# Patient Record
Sex: Male | Born: 1970 | Race: White | Hispanic: No | Marital: Married | Smoking: Never smoker
Health system: Southern US, Community
[De-identification: ages and names within clinical notes are randomized; demographics above are authoritative.]

## PROBLEM LIST (undated history)

## (undated) DIAGNOSIS — G4730 Sleep apnea, unspecified: Secondary | ICD-10-CM

## (undated) HISTORY — PX: HERNIA REPAIR: SHX51

---

## 1998-10-07 ENCOUNTER — Emergency Department (HOSPITAL_COMMUNITY): Admission: EM | Admit: 1998-10-07 | Discharge: 1998-10-07 | Payer: Self-pay | Admitting: Emergency Medicine

## 2002-06-30 ENCOUNTER — Emergency Department (HOSPITAL_COMMUNITY): Admission: EM | Admit: 2002-06-30 | Discharge: 2002-06-30 | Payer: Self-pay | Admitting: Emergency Medicine

## 2002-06-30 ENCOUNTER — Encounter: Payer: Self-pay | Admitting: Emergency Medicine

## 2003-12-10 ENCOUNTER — Emergency Department (HOSPITAL_COMMUNITY): Admission: EM | Admit: 2003-12-10 | Discharge: 2003-12-10 | Payer: Self-pay | Admitting: Emergency Medicine

## 2003-12-15 ENCOUNTER — Emergency Department (HOSPITAL_COMMUNITY): Admission: EM | Admit: 2003-12-15 | Discharge: 2003-12-15 | Payer: Self-pay | Admitting: Emergency Medicine

## 2004-03-14 ENCOUNTER — Emergency Department (HOSPITAL_COMMUNITY): Admission: EM | Admit: 2004-03-14 | Discharge: 2004-03-14 | Payer: Self-pay | Admitting: Emergency Medicine

## 2004-08-18 ENCOUNTER — Encounter: Admission: RE | Admit: 2004-08-18 | Discharge: 2004-08-18 | Payer: Self-pay | Admitting: Gastroenterology

## 2004-08-26 ENCOUNTER — Emergency Department (HOSPITAL_COMMUNITY): Admission: EM | Admit: 2004-08-26 | Discharge: 2004-08-26 | Payer: Self-pay | Admitting: Emergency Medicine

## 2005-09-05 ENCOUNTER — Emergency Department (HOSPITAL_COMMUNITY): Admission: EM | Admit: 2005-09-05 | Discharge: 2005-09-06 | Payer: Self-pay | Admitting: Emergency Medicine

## 2006-01-02 IMAGING — RF DG UGI W/ HIGH DENSITY W/KUB
12 series · 17 of 17 positions shown · non-contrast
Comparison: none

CLINICAL DATA: Chest pain.
 ULTRASOUND OF ABDOMEN, COMPLETE:
 There is considerable bowel gas and, as a result, there is limited visualization of the pancreas, inferior vena cava, and abdominal aorta. The gallbladder is adequately visualized and has a normal appearance. The common bile duct measures 4 mm in diameter.  The distal common bile duct is obscured by gas.  Pancreas is not well visualized.  Also, the left lobe of liver is obscured by gas.  The spleen and kidneys have a normal appearance.

[Series 1: run · 1 of 1 slices shown (1 of 11)]
[im 1/1]
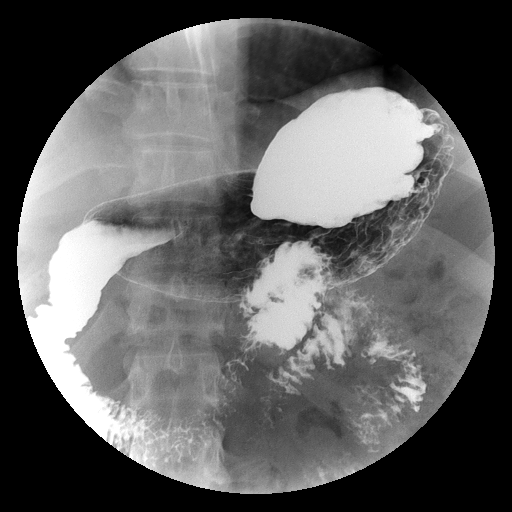

[Series 2: run · 1 of 1 slices shown (2 of 11)]
[im 1/1]
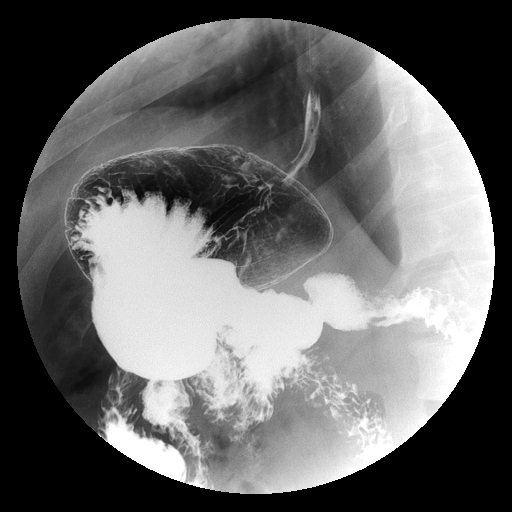

[Series 3: run · 1 of 1 slices shown (3 of 11)]
[im 1/1]
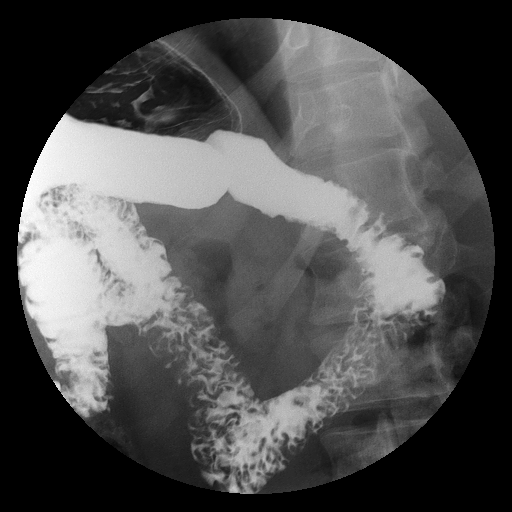

[Series 4: run · 5 of 5 slices shown (4 of 11)]
[im 1/5]
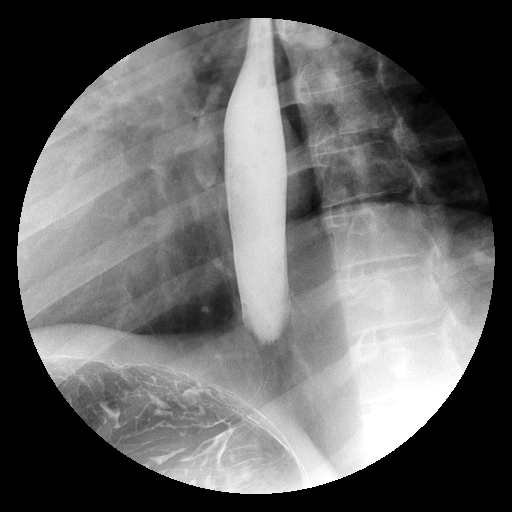
[im 2/5]
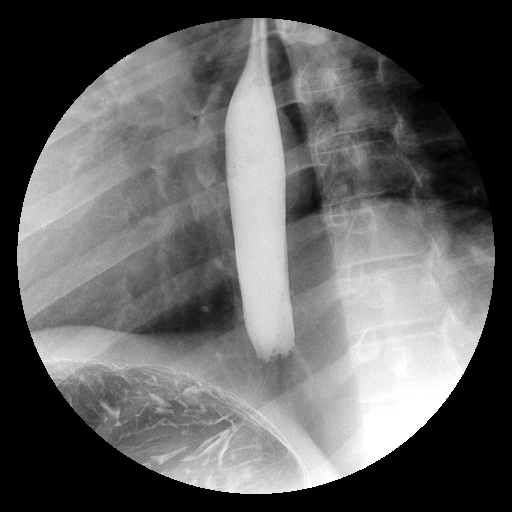
[im 3/5]
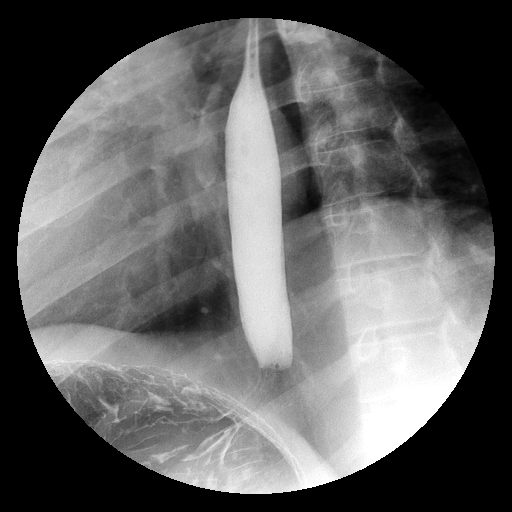
[im 4/5]
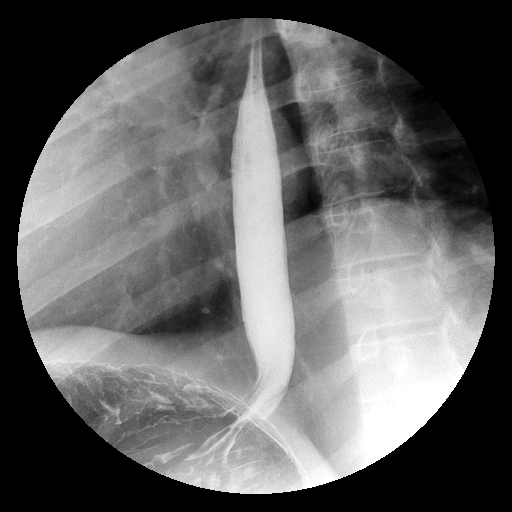
[im 5/5]
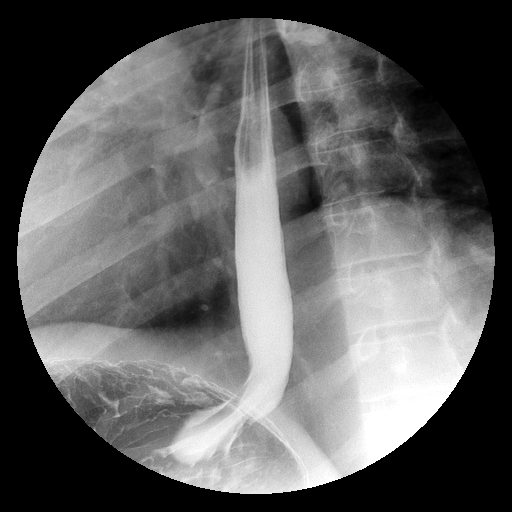

[Series 5: run · 2 of 2 slices shown (5 of 11)]
[im 1/2]
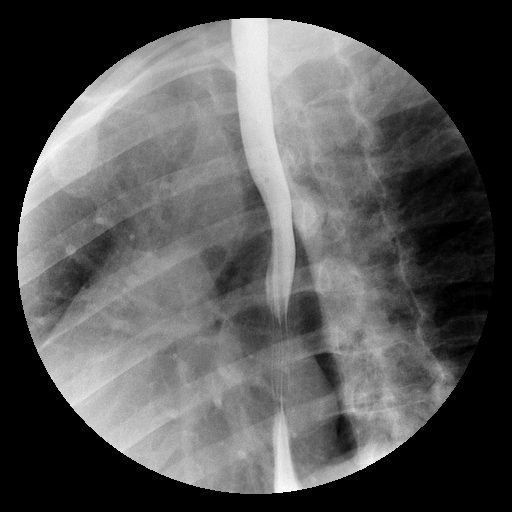
[im 2/2]
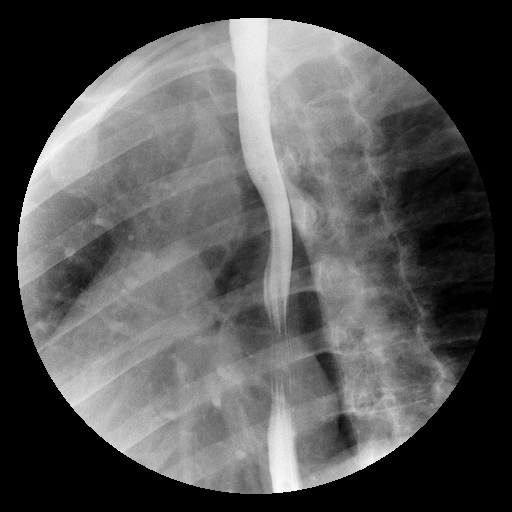

[Series 6: run · 1 of 1 slices shown (6 of 11)]
[im 1/1]
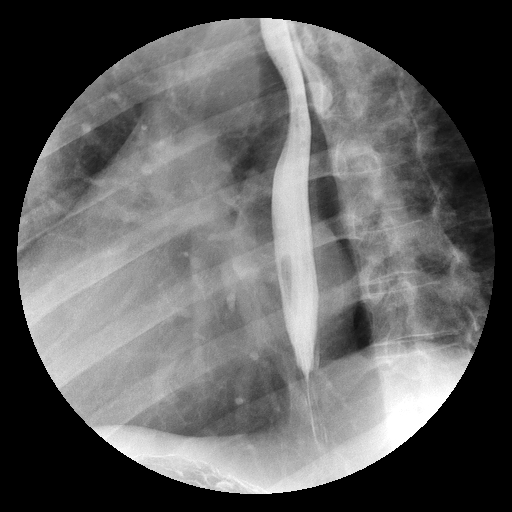

[Series 7: run · 1 of 1 slices shown (7 of 11)]
[im 1/1]
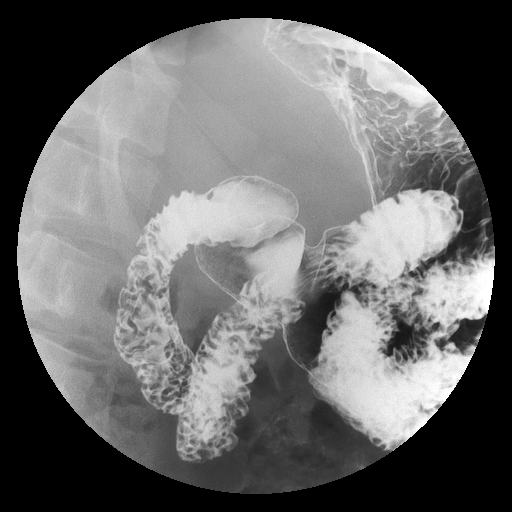

[Series 8: run · 1 of 1 slices shown (8 of 11)]
[im 1/1]
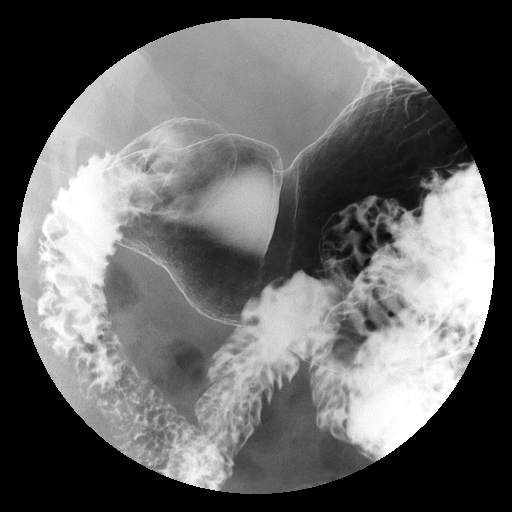

[Series 9: run · 1 of 1 slices shown (9 of 11)]
[im 1/1]
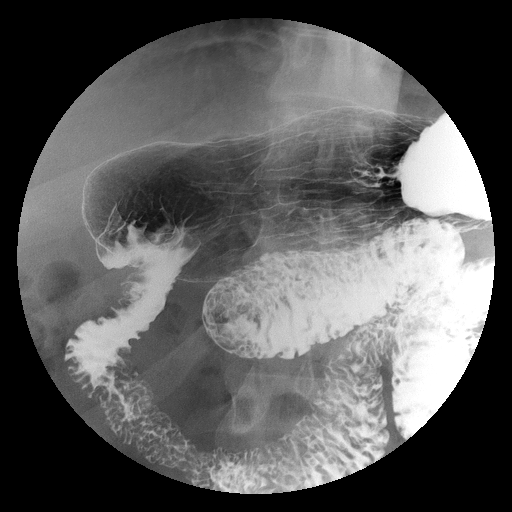

[Series 10: run · 1 of 1 slices shown (10 of 11)]
[im 1/1]
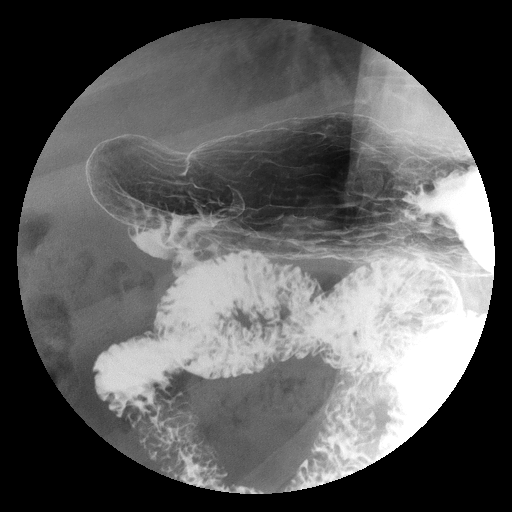

[Series 11: run · 1 of 1 slices shown (11 of 11)]
[im 1/1]
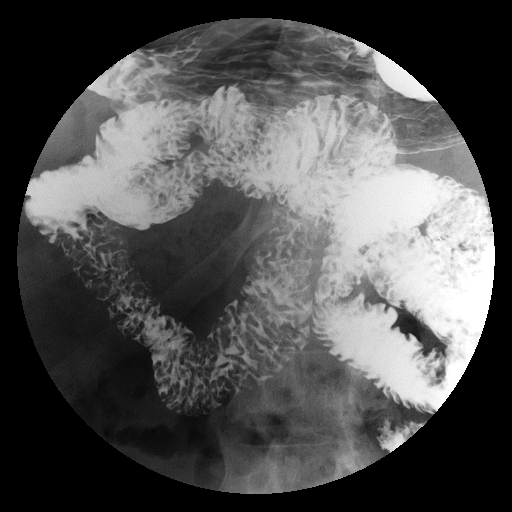

[Series 1001: view not recorded · 0.20mm/px · 1 of 1 slices shown]
[im 1/1]
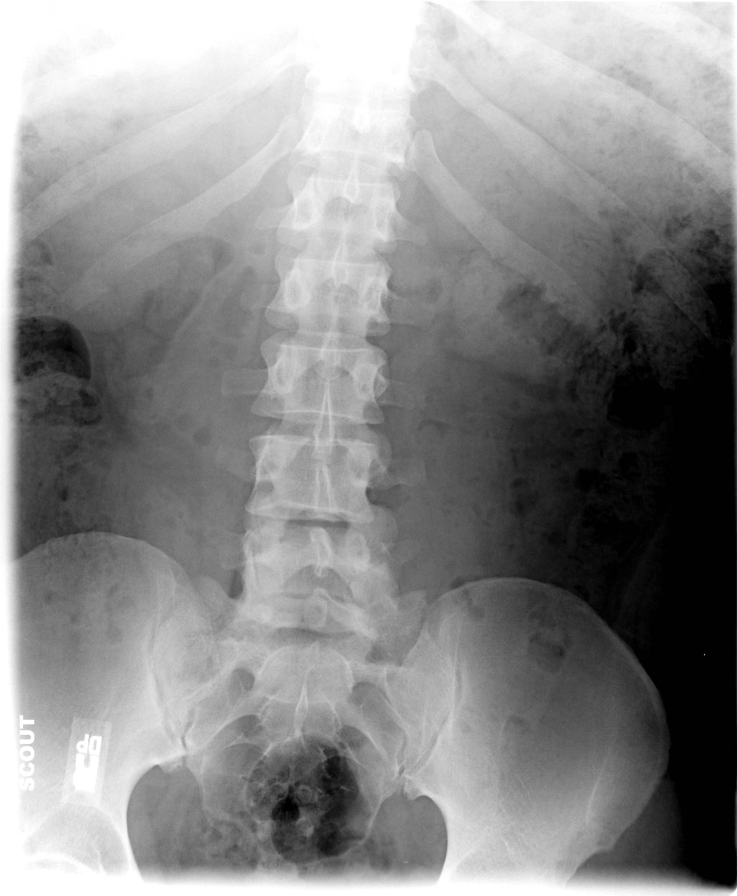

[17 of 17 positions shown; findings below may reference images not displayed]

IMPRESSION: Limited study due to bowel gas, as discussed above.  Normal appearing gallbladder and common bile duct.
 AIR CONTRAST UPPER GI SERIES:
 The primary peristaltic wave in the esophagus is normal.  There was no hiatal hernia and there were no episodes of spontaneous gastroesophageal reflux.  The stomach, duodenal bulb, and sweep were adequately visualized and had a normal appearance.
IMPRESSION: Normal study.

## 2012-08-16 ENCOUNTER — Encounter: Payer: Self-pay | Admitting: *Deleted

## 2012-08-16 ENCOUNTER — Emergency Department (INDEPENDENT_AMBULATORY_CARE_PROVIDER_SITE_OTHER)
Admission: EM | Admit: 2012-08-16 | Discharge: 2012-08-16 | Disposition: A | Discharge status: Home / Self Care | Payer: BC Managed Care – PPO | Source: Home / Self Care | Attending: Family Medicine | Admitting: Family Medicine

## 2012-08-16 DIAGNOSIS — J329 Chronic sinusitis, unspecified: Secondary | ICD-10-CM

## 2012-08-16 DIAGNOSIS — J40 Bronchitis, not specified as acute or chronic: Secondary | ICD-10-CM

## 2012-08-16 DIAGNOSIS — J029 Acute pharyngitis, unspecified: Secondary | ICD-10-CM

## 2012-08-16 MED ORDER — AZITHROMYCIN 250 MG PO TABS: ORAL_TABLET | ORAL | Status: DC

## 2012-08-16 MED ORDER — METHYLPREDNISOLONE ACETATE 80 MG/ML IJ SUSP
80.0000 mg | Freq: Once | INTRAMUSCULAR | Status: AC
  Administered 2012-08-16: 80 mg via INTRAMUSCULAR

## 2012-08-16 MED ORDER — ALBUTEROL SULFATE HFA 108 (90 BASE) MCG/ACT IN AERS: 2.0000 | INHALATION_SPRAY | Freq: Four times a day (QID) | RESPIRATORY_TRACT | Status: AC | PRN

## 2012-08-16 NOTE — ED Notes (Signed)
Pt c/o sore throat, cough and nasal congestion x 3 days. Denies fever.

## 2012-08-16 NOTE — ED Provider Notes (Signed)
History     CSN: [id number]  Arrival date & time 08/16/12  1448   First MD Initiated Contact with Patient 08/16/12 1504      Chief Complaint  Patient presents with  . Sore Throat  . Cough  . Nasal Congestion   HPI  URI Symptoms Onset: 3-4 days  Description: sinus pressure, nasal congestion, cough  Modifying factors:  Prior hx/o bronchitis, PNA. No SOB. Sxs not similar to previous episodes of PNA. Pt works in [employer]. Cough seems to be induced by cold air.    Symptoms Nasal discharge: yes Fever: no Sore throat: no Cough: yes Wheezing: mild Ear pain: no GI symptoms: no Sick contacts: yes  Red Flags  Stiff neck: no Dyspnea: no Rash: no Swallowing difficulty: no  Sinusitis Risk Factors Headache/face pain: yes Double sickening: no tooth pain: no  Allergy Risk Factors Sneezing: no Itchy scratchy throat: no Seasonal symptoms: no  Flu Risk Factors Headache: no muscle aches: no severe fatigue: no    History reviewed. No pertinent past medical history.  Past Surgical History  Procedure Laterality Date  . Hernia repair      Family History  Problem Relation Age of Onset  . Diabetes Father   . Rheum arthritis Father     History  Substance Use Topics  . Smoking status: Never Smoker   . Smokeless tobacco: Not on file  . Alcohol Use: No      Review of Systems  All other systems reviewed and are negative.    Allergies  Review of patient's allergies indicates no known allergies.  Home Medications   Current Outpatient Rx  Name  Route  Sig  Dispense  Refill  . albuterol (PROVENTIL HFA;VENTOLIN HFA) 108 (90 BASE) MCG/ACT inhaler   Inhalation   Inhale 2 puffs into the lungs every 6 (six) hours as needed for wheezing.   1 Inhaler   2   . azithromycin (ZITHROMAX) 250 MG tablet      Take 2 tabs PO x 1 dose, then 1 tab PO QD x 4 days   6 tablet   0     BP 133/83  Pulse 86  Temp(Src) 97.9 F (36.6 C) (Oral)  Ht 6' (1.829 m)   Wt 271 lb (122.925 kg)  BMI 36.75 kg/m2  SpO2 99%  Physical Exam  Constitutional:  Obese   HENT:  Head: Normocephalic and atraumatic.  Right Ear: External ear normal.  Left Ear: External ear normal.  +nasal erythema, rhinorrhea bilaterally, + post oropharyngeal erythema  + maxillary tenderness bilaterally    Cardiovascular: Normal rate and regular rhythm.   Pulmonary/Chest: Effort normal and breath sounds normal.  Abdominal: Soft.  Musculoskeletal: Normal range of motion.  Neurological: He is alert.  Skin: Skin is warm.    ED Course  Procedures (including critical care time)  Labs Reviewed  POCT RAPID STREP A (OFFICE)   No results found.   1. Sinusitis   2. Bronchitis       MDM  Will treat with zpak.  Depomedrol 80mg  IMx1 as there is an element of cold induced bronchospasm in sxs.  Albuterol as needed.  No clinical signs of PNA currently (lungs clear, afebrile, no SOB).  Zpak will give good resp coverage.  Discussed infectious and resp red flags at length.  Follow up as needed.     The patient and/or caregiver has been counseled thoroughly with regard to treatment plan and/or medications prescribed including dosage, schedule, interactions, rationale for use,  and possible side effects and they verbalize understanding. Diagnoses and expected course of recovery discussed and will return if not improved as expected or if the condition worsens. Patient and/or caregiver verbalized understanding.             Doree Albee, MD 08/16/12 [phone]

## 2012-11-07 ENCOUNTER — Emergency Department (INDEPENDENT_AMBULATORY_CARE_PROVIDER_SITE_OTHER)
Admission: EM | Admit: 2012-11-07 | Discharge: 2012-11-07 | Disposition: A | Discharge status: Home / Self Care | Payer: BC Managed Care – PPO | Source: Home / Self Care

## 2012-11-07 ENCOUNTER — Encounter: Payer: Self-pay | Admitting: *Deleted

## 2012-11-07 DIAGNOSIS — M7061 Trochanteric bursitis, right hip: Secondary | ICD-10-CM

## 2012-11-07 DIAGNOSIS — B029 Zoster without complications: Secondary | ICD-10-CM

## 2012-11-07 DIAGNOSIS — M76899 Other specified enthesopathies of unspecified lower limb, excluding foot: Secondary | ICD-10-CM

## 2012-11-07 MED ORDER — VALACYCLOVIR HCL 1 G PO TABS: 1000.0000 mg | ORAL_TABLET | Freq: Three times a day (TID) | ORAL | Status: DC

## 2012-11-07 NOTE — ED Provider Notes (Signed)
History     CSN: [id number]  Arrival date & time 11/07/12  1241   None     Chief Complaint  Patient presents with  . Rash  . Hip Pain      HPI Comments: Patient presents with two complaints: 1)  Yesterday he felt unusually fatigued.  Later in the day he noticed a slightly pruritic rash on his left lower back.  Today the rash has increased in size.  No fevers, chills, and sweats.  2)  For about a month he has had localized pain over his right hip.  The pain does not radiate, and is worse with flexion of his hip.  He recalls no trauma to the area or change in activities.  He has pain when supine in bed on his right side.  Patient is a 42 y.o. male presenting with rash and hip pain. The history is provided by the patient.  Rash Location: left lower back. Quality: itchiness and redness   Quality: not blistering, not draining, not painful, not swelling and not weeping   Severity:  Mild Onset quality:  Sudden Duration:  1 day Timing:  Constant Progression:  Worsening Chronicity:  New Context: not animal contact, not chemical exposure, not insect bite/sting, not medications, not new detergent/soap and not plant contact   Relieved by:  Nothing Worsened by:  Nothing tried Ineffective treatments:  None tried Associated symptoms: fatigue   Associated symptoms: no abdominal pain, no fever, no headaches and no myalgias   Hip Pain This is a new problem. Episode onset: 1.5 months ago. The problem occurs daily. The problem has not changed since onset.Pertinent negatives include no abdominal pain and no headaches. Associated symptoms comments: none. The symptoms are aggravated by walking. Nothing relieves the symptoms. He has tried nothing for the symptoms.    History reviewed. No pertinent past medical history.  Past Surgical History  Procedure Laterality Date  . Hernia repair      Family History  Problem Relation Age of Onset  . Diabetes Father   . Rheum arthritis Father   .  Cancer Mother     History  Substance Use Topics  . Smoking status: Never Smoker   . Smokeless tobacco: Not on file  . Alcohol Use: No      Review of Systems  Constitutional: Positive for fatigue. Negative for fever.  Gastrointestinal: Negative for abdominal pain.  Musculoskeletal: Negative for myalgias.  Skin: Positive for rash.  Neurological: Negative for headaches.  All other systems reviewed and are negative.    Allergies  Review of patient's allergies indicates no known allergies.  Home Medications   Current Outpatient Rx  Name  Route  Sig  Dispense  Refill  . albuterol (PROVENTIL HFA;VENTOLIN HFA) 108 (90 BASE) MCG/ACT inhaler   Inhalation   Inhale 2 puffs into the lungs every 6 (six) hours as needed for wheezing.   1 Inhaler   2   . azithromycin (ZITHROMAX) 250 MG tablet      Take 2 tabs PO x 1 dose, then 1 tab PO QD x 4 days   6 tablet   0   . valACYclovir (VALTREX) 1000 MG tablet   Oral   Take 1 tablet (1,000 mg total) by mouth 3 (three) times daily.   21 tablet   0     BP 124/88  Pulse 103  Temp(Src) 98.2 F (36.8 C) (Oral)  Resp 16  Ht 5\' 11"  (1.803 m)  Wt 273 lb (123.832 kg)  BMI 38.09 kg/m2  SpO2 97%  Physical Exam  Nursing note and vitals reviewed. Constitutional: He is oriented to person, place, and time. He appears well-developed and well-nourished. No distress.  Patient is obese (BMI 38.1)  HENT:  Head: Normocephalic.  Eyes: Conjunctivae are normal. Pupils are equal, round, and reactive to light.  Cardiovascular: Normal heart sounds.   Pulmonary/Chest: Breath sounds normal.  Abdominal: There is no tenderness.  Musculoskeletal: Normal range of motion. He exhibits tenderness.       Right hip: He exhibits tenderness. He exhibits normal range of motion, normal strength, no swelling, no crepitus and no deformity.       Legs: Right hip reveals distinct tenderness over the greater trochanter.  Palpating the greater trochanter during  resisted lateral abduction of the hip recreates his pain.   Lymphadenopathy:    He has no cervical adenopathy.  Neurological: He is alert and oriented to person, place, and time.  Skin: Skin is warm and dry. Rash noted. No purpura noted. Rash is macular. Rash is not papular, not nodular, not pustular, not vesicular and not urticarial.     On the left sacral region as noted on diagram is an erythematous herpetiform eruption without tenderness, swelling, or fluctuance.    ED Course  Procedures None      1. Herpes zoster   2. Trochanteric bursitis of right hip       MDM  Begin Valtrex. Begin Ibuprofen 200mg , 4 tabs every 8 hours with food.  Apply ice pack to right hip 3 to 4 times daily.  Begin hip exercises as per instruction sheet ([facility] information and instruction handout given)  Followup with [facility] if not improving about two weeks.         Lattie Haw, MD 11/07/12 [phone]

## 2012-11-07 NOTE — ED Notes (Signed)
Pt c/o fatigue and rash on his LT low back area x 1 day. Denies fever. He denies recent tick bite. He also c/o intermittent RT hip pain x . Denies injury.

## 2013-06-24 ENCOUNTER — Emergency Department (INDEPENDENT_AMBULATORY_CARE_PROVIDER_SITE_OTHER): Payer: BC Managed Care – PPO

## 2013-06-24 ENCOUNTER — Encounter: Payer: Self-pay | Admitting: Emergency Medicine

## 2013-06-24 ENCOUNTER — Emergency Department (INDEPENDENT_AMBULATORY_CARE_PROVIDER_SITE_OTHER)
Admission: EM | Admit: 2013-06-24 | Discharge: 2013-06-24 | Disposition: A | Discharge status: Home / Self Care | Payer: BC Managed Care – PPO | Source: Home / Self Care | Attending: Family Medicine | Admitting: Family Medicine

## 2013-06-24 DIAGNOSIS — R05 Cough: Secondary | ICD-10-CM

## 2013-06-24 DIAGNOSIS — R0602 Shortness of breath: Secondary | ICD-10-CM

## 2013-06-24 DIAGNOSIS — J069 Acute upper respiratory infection, unspecified: Secondary | ICD-10-CM

## 2013-06-24 DIAGNOSIS — R059 Cough, unspecified: Secondary | ICD-10-CM

## 2013-06-24 DIAGNOSIS — Z8701 Personal history of pneumonia (recurrent): Secondary | ICD-10-CM

## 2013-06-24 MED ORDER — AZITHROMYCIN 250 MG PO TABS: ORAL_TABLET | ORAL | Status: DC

## 2013-06-24 NOTE — ED Notes (Signed)
Pt has sore throat ear pain cough and congestion lasting 5 days.

## 2013-06-24 NOTE — ED Provider Notes (Signed)
CSN: [id number]     Arrival date & time 06/24/13  1625 History   First MD Initiated Contact with Patient 06/24/13 1644     Chief Complaint  Patient presents with  . Sore Throat    HPI  URI Symptoms Onset: 5 days  Description: rhinorrhea, nasal congestion, cough, mild dyspnea  Modifying factors:  Prior hx/o PNA. Sxs do not seem similar to past PNAs, though with some SOB.   Symptoms Nasal discharge: yes Fever: no Sore throat: yes Cough: yes Wheezing: no Ear pain: no GI symptoms: no Sick contacts: yes  Red Flags  Stiff neck: no Dyspnea: minimal  Rash: no Swallowing difficulty: n  Sinusitis Risk Factors Headache/face pain: no Double sickening: no tooth pain: no  Allergy Risk Factors Sneezing: no Itchy scratchy throat: no Seasonal symptoms: no  Flu Risk Factors Headache: no muscle aches: no severe fatigue: no   History reviewed. No pertinent past medical history. Past Surgical History  Procedure Laterality Date  . Hernia repair     Family History  Problem Relation Age of Onset  . Diabetes Father   . Rheum arthritis Father   . Cancer Mother    History  Substance Use Topics  . Smoking status: Never Smoker   . Smokeless tobacco: Not on file  . Alcohol Use: No    Review of Systems  All other systems reviewed and are negative.    Allergies  Review of patient's allergies indicates no known allergies.  Home Medications   Current Outpatient Rx  Name  Route  Sig  Dispense  Refill  . albuterol (PROVENTIL HFA;VENTOLIN HFA) 108 (90 BASE) MCG/ACT inhaler   Inhalation   Inhale 2 puffs into the lungs every 6 (six) hours as needed for wheezing.   1 Inhaler   2   . azithromycin (ZITHROMAX) 250 MG tablet      Take 2 tabs PO x 1 dose, then 1 tab PO QD x 4 days   6 tablet   0   . valACYclovir (VALTREX) 1000 MG tablet   Oral   Take 1 tablet (1,000 mg total) by mouth 3 (three) times daily.   21 tablet   0    BP 123/80  Pulse 95  Temp(Src) 98.7 F  (37.1 C) (Oral)  Ht 6' (1.829 m)  Wt 289 lb 12.8 oz (131.452 kg)  BMI 39.30 kg/m2  SpO2 95% Physical Exam  Constitutional: He appears well-developed and well-nourished.  HENT:  Head: Normocephalic and atraumatic.  Right Ear: External ear normal.  Left Ear: External ear normal.  +nasal erythema, rhinorrhea bilaterally, + post oropharyngeal erythema    Eyes: Conjunctivae are normal. Pupils are equal, round, and reactive to light.  Neck: Normal range of motion. Neck supple.  Cardiovascular: Normal rate, regular rhythm and normal heart sounds.   Pulmonary/Chest: Effort normal and breath sounds normal. He has no wheezes. He has no rales.  Abdominal: Soft. Bowel sounds are normal.  Musculoskeletal: Normal range of motion.  Neurological: He is alert.  Skin: Skin is warm.    ED Course  Procedures (including critical care time) Labs Review Labs Reviewed  POCT RAPID STREP A (OFFICE)   Imaging Review Dg Chest 2 View  06/24/2013   CLINICAL DATA:  Cough  EXAM: CHEST  2 VIEW  COMPARISON:  09/06/2005  FINDINGS: Normal heart size.  Clear lungs.  IMPRESSION: No active cardiopulmonary disease.   Electronically Signed   By: Maryclare Bean M.D.   On: 06/24/2013 17:31  EKG Interpretation    Date/Time:    Ventricular Rate:    PR Interval:    QRS Duration:   QT Interval:    QTC Calculation:   R Axis:     Text Interpretation:              MDM   1. URI (upper respiratory infection)    Likely viral process Rapid strep negative CXR negative for PNA  Overall stable/reassuring resp exam.  Will rx ppx zpak in case sxs worse/fail to improve.  Discussed infectious and resp red flags at length.  Follow up as needed.      Doree Albee, MD 06/24/13 [phone]

## 2013-09-02 ENCOUNTER — Encounter: Payer: Self-pay | Admitting: Emergency Medicine

## 2013-09-02 ENCOUNTER — Emergency Department (INDEPENDENT_AMBULATORY_CARE_PROVIDER_SITE_OTHER)
Admission: EM | Admit: 2013-09-02 | Discharge: 2013-09-02 | Disposition: A | Discharge status: Home / Self Care | Payer: BC Managed Care – PPO | Source: Home / Self Care | Attending: Family Medicine | Admitting: Family Medicine

## 2013-09-02 DIAGNOSIS — J069 Acute upper respiratory infection, unspecified: Secondary | ICD-10-CM

## 2013-09-02 HISTORY — DX: Sleep apnea, unspecified: G47.30

## 2013-09-02 MED ORDER — PREDNISONE 20 MG PO TABS: 20.0000 mg | ORAL_TABLET | Freq: Two times a day (BID) | ORAL | Status: DC

## 2013-09-02 MED ORDER — AZITHROMYCIN 250 MG PO TABS
ORAL_TABLET | ORAL | Status: DC
Start: 2013-09-02 — End: 2015-09-10

## 2013-09-02 MED ORDER — BENZONATATE 200 MG PO CAPS: 200.0000 mg | ORAL_CAPSULE | Freq: Every day | ORAL | Status: DC

## 2013-09-02 NOTE — ED Provider Notes (Signed)
CSN: [id number]     Arrival date & time 09/02/13  1148 History   First MD Initiated Contact with Patient 09/02/13 1206     Chief Complaint  Patient presents with  . Cough  . Nasal Congestion      HPI Comments: Patient developed onset of nasal congestion three days ago, followed by sweats, mild sore throat, and cough. He has a history of mild asthma as a child.  He started treatment for sleep apnea three months ago.  The history is provided by the patient.    Past Medical History  Diagnosis Date  . Sleep apnea    Past Surgical History  Procedure Laterality Date  . Hernia repair     Family History  Problem Relation Age of Onset  . Diabetes Father   . Rheum arthritis Father   . Cancer Mother    History  Substance Use Topics  . Smoking status: Never Smoker   . Smokeless tobacco: Not on file  . Alcohol Use: No    Review of Systems + sore throat + cough No pleuritic pain No wheezing + nasal congestion + post-nasal drainage No sinus pain/pressure No itchy/red eyes No earache No hemoptysis No SOB No fever/chills No nausea No vomiting No abdominal pain No diarrhea No urinary symptoms No skin rash + fatigue No myalgias No headache Used OTC meds without relief  Allergies  Review of patient's allergies indicates no known allergies.  Home Medications   Current Outpatient Rx  Name  Route  Sig  Dispense  Refill  . albuterol (PROVENTIL HFA;VENTOLIN HFA) 108 (90 BASE) MCG/ACT inhaler   Inhalation   Inhale 2 puffs into the lungs every 6 (six) hours as needed for wheezing.   1 Inhaler   2   . azithromycin (ZITHROMAX) 250 MG tablet      Take 2 tabs PO x 1 dose, then 1 tab PO QD x 4 days   6 tablet   0   . benzonatate (TESSALON) 200 MG capsule   Oral   Take 1 capsule (200 mg total) by mouth at bedtime. Take as needed for cough   12 capsule   0   . predniSONE (DELTASONE) 20 MG tablet   Oral   Take 1 tablet (20 mg total) by mouth 2 (two) times daily. Take  with food.   10 tablet   0   . valACYclovir (VALTREX) 1000 MG tablet   Oral   Take 1 tablet (1,000 mg total) by mouth 3 (three) times daily.   21 tablet   0    BP 134/86  Pulse 93  Temp(Src) 98.3 F (36.8 C) (Oral)  Resp 16  Ht 5' 11"  (1.803 m)  Wt 294 lb 8 oz (133.584 kg)  BMI 41.09 kg/m2  SpO2 97% Physical Exam Nursing notes and Vital Signs reviewed. Appearance:  Patient appears stated age, and in no acute distress.  Patient is obese (BMI 41.1) Eyes:  Pupils are equal, round, and reactive to light and accomodation.  Extraocular movement is intact.  Conjunctivae are not inflamed  Ears:  Canals normal.  Tympanic membranes normal.  Nose:  Mildly congested turbinates.  No sinus tenderness.   Pharynx:  Normal Neck:  Supple.   Tender shotty posterior nodes are palpated bilaterally  Lungs:  Faint wheezes right posterior chest.  Breath sounds are equal.  Heart:  Regular rate and rhythm without murmurs, rubs, or gallops.  Abdomen:  Nontender without masses or hepatosplenomegaly.  Bowel sounds are present.  No CVA or flank tenderness.  Extremities:  No edema.  No calf tenderness Skin:  No rash present.   ED Course  Procedures none      MDM   1. Acute upper respiratory infections of unspecified site    Begin prednisone burst.  Prescription written for Benzonatate (Tessalon) to take at bedtime for night-time cough.  Begin Z-pack to cover atypicals. Take plain Mucinex (1200 mg guaifenesin) twice daily for cough and congestion.  May add Sudafed for sinus congestion.   Increase fluid intake, rest. May use Afrin nasal spray (or generic oxymetazoline) twice daily for about 5 days.  Also recommend using saline nasal spray several times daily and saline nasal irrigation (AYR is a common brand) Try warm salt water gargles for sore throat.  Stop all antihistamines for now, and other non-prescription cough/cold preparations. May take Ibuprofen 230m, 4 tabs every 8 hours with food for  fever, headache, etc.   Follow-up with family doctor if not improving 7 to 10 days.     SKandra Nicolas MD 09/04/13 [phone]

## 2013-09-02 NOTE — Discharge Instructions (Signed)
Take plain Mucinex (1200 mg guaifenesin) twice daily for cough and congestion.  May add Sudafed for sinus congestion.   Increase fluid intake, rest. May use Afrin nasal spray (or generic oxymetazoline) twice daily for about 5 days.  Also recommend using saline nasal spray several times daily and saline nasal irrigation (AYR is a common brand) Try warm salt water gargles for sore throat.  Stop all antihistamines for now, and other non-prescription cough/cold preparations. May take Ibuprofen 237m, 4 tabs every 8 hours with food for fever, headache, etc.   Follow-up with family doctor if not improving 7 to 10 days.

## 2013-09-02 NOTE — ED Notes (Signed)
Patient c/o productive cough, nasal congestion and sore throat x 3 days. Has tried OTC Zicam and Tylenol Sinus without relief.

## 2014-11-08 IMAGING — CR DG CHEST 2V
2 series · 2 of 2 positions shown · non-contrast
Comparison: 09/06/2005

CLINICAL DATA: Cough

EXAM:
CHEST  2 VIEW

[view not recorded (1 of 2)]
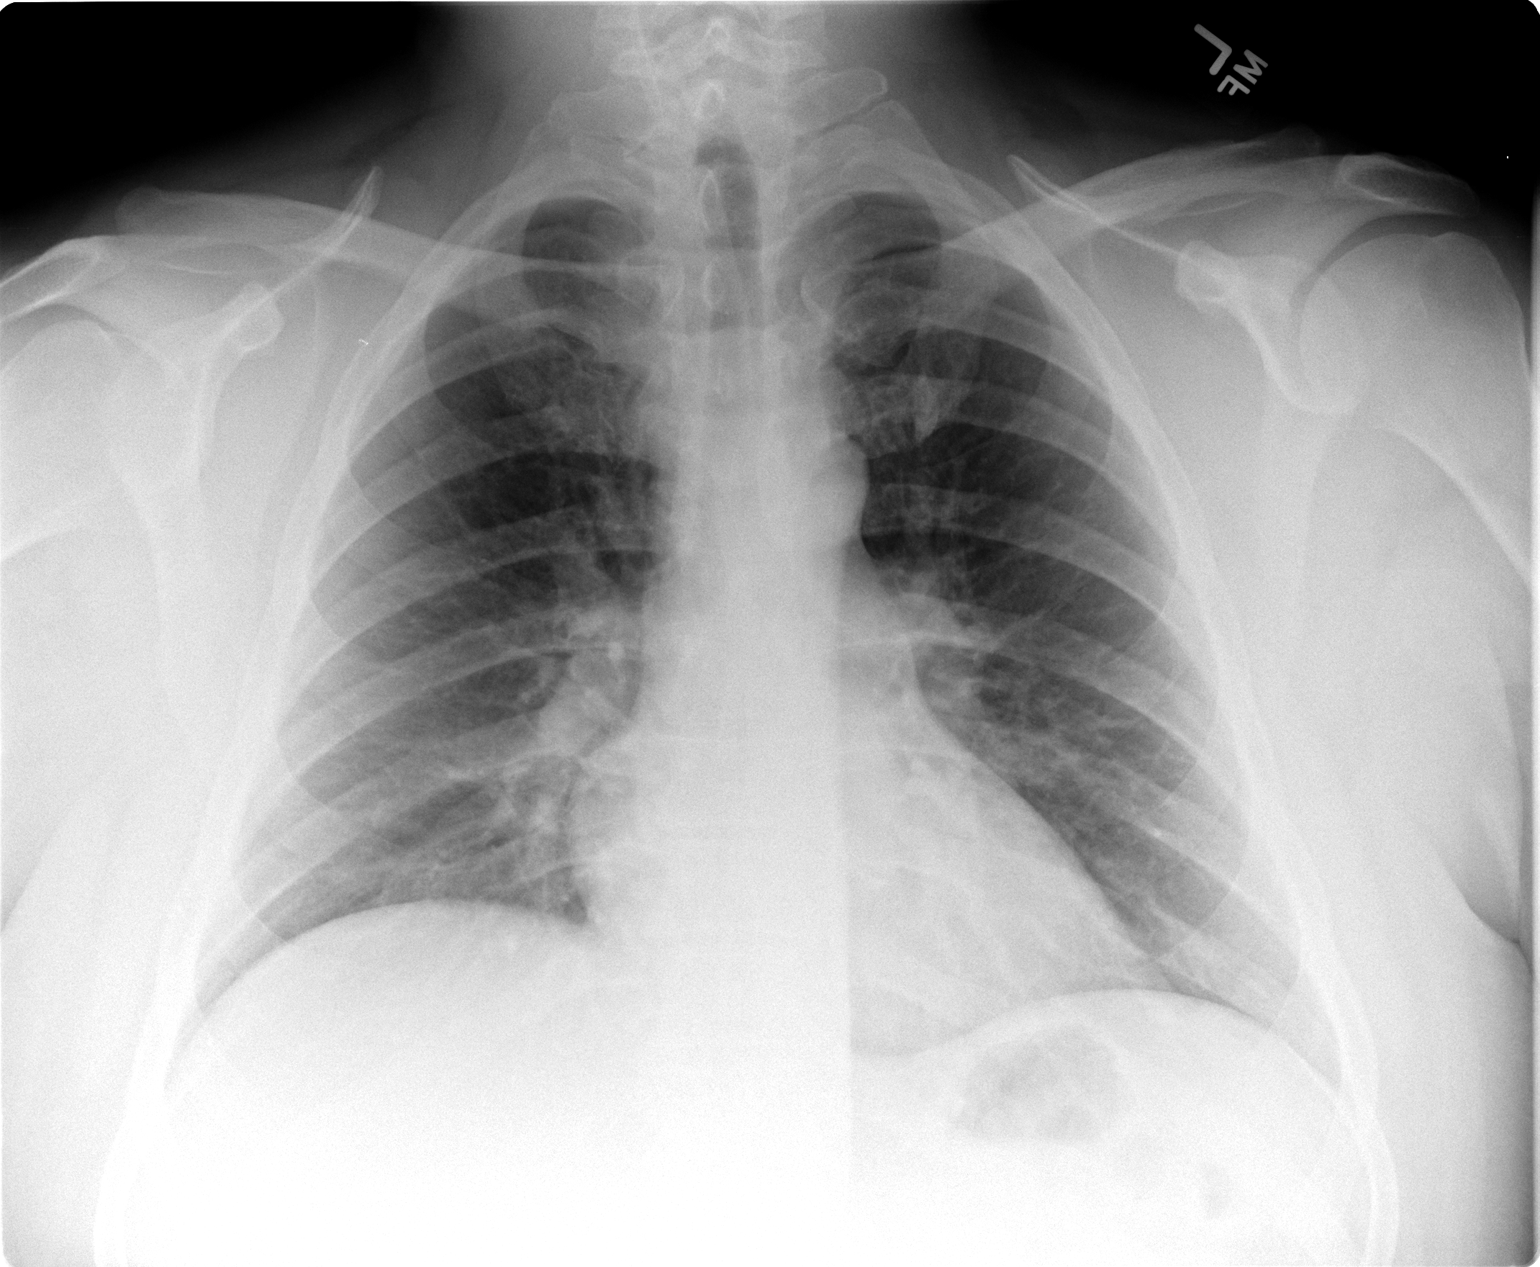

[view not recorded (2 of 2)]
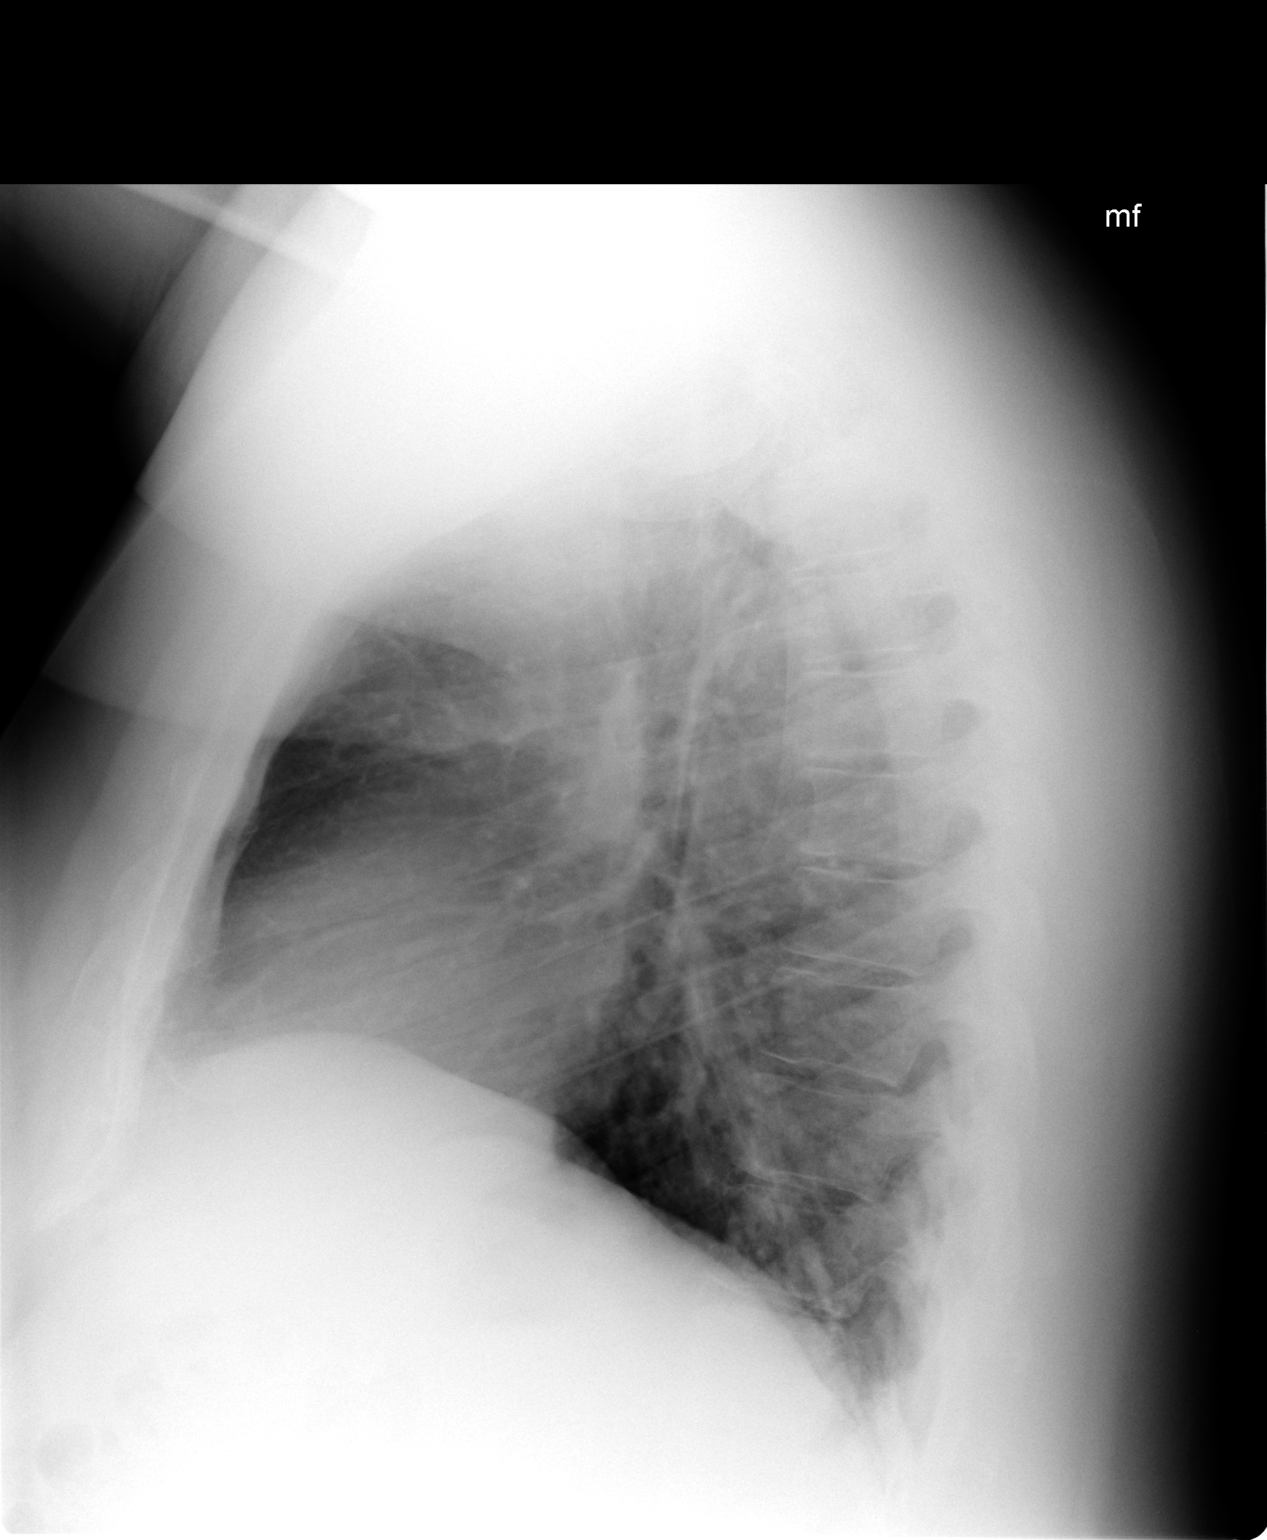

[2 of 2 positions shown; findings below may reference images not displayed]

FINDINGS: Normal heart size.  Clear lungs.
IMPRESSION: No active cardiopulmonary disease.

## 2015-09-10 ENCOUNTER — Emergency Department (INDEPENDENT_AMBULATORY_CARE_PROVIDER_SITE_OTHER)
Admission: EM | Admit: 2015-09-10 | Discharge: 2015-09-10 | Disposition: A | Discharge status: Home / Self Care | Payer: BLUE CROSS/BLUE SHIELD | Source: Home / Self Care | Attending: Family Medicine | Admitting: Family Medicine

## 2015-09-10 ENCOUNTER — Encounter: Payer: Self-pay | Admitting: *Deleted

## 2015-09-10 DIAGNOSIS — L255 Unspecified contact dermatitis due to plants, except food: Secondary | ICD-10-CM

## 2015-09-10 MED ORDER — TRIAMCINOLONE ACETONIDE 40 MG/ML IJ SUSP
40.0000 mg | Freq: Once | INTRAMUSCULAR | Status: AC
  Administered 2015-09-10: 40 mg via INTRAMUSCULAR

## 2015-09-10 NOTE — ED Provider Notes (Signed)
CSN: [id number]     Arrival date & time 09/10/15  9093 History   First MD Initiated Contact with Patient 09/10/15 1032     Chief Complaint  Patient presents with  . Rash      HPI Comments: Patient states that he was working in his yard four days ago and believes that he came into contact with poison sumac or ivy, resulting in a pruritic rash between his fingers and on his face.  He feels well otherwise.  Patient is a 45 y.o. male presenting with poison ivy. The history is provided by the patient.  Poison Karlene Einstein This is a new problem. Episode onset: 3 days ago. Exacerbated by: sweating. Nothing relieves the symptoms. Treatments tried: topical spray. The treatment provided no relief.    Past Medical History  Diagnosis Date  . Sleep apnea    Past Surgical History  Procedure Laterality Date  . Hernia repair     Family History  Problem Relation Age of Onset  . Diabetes Father   . Rheum arthritis Father   . Cancer Mother    Social History  Substance Use Topics  . Smoking status: Never Smoker   . Smokeless tobacco: None  . Alcohol Use: No    Review of Systems  Constitutional: Negative for fever, chills, diaphoresis and fatigue.  Skin: Positive for rash.  All other systems reviewed and are negative.   Allergies  Review of patient's allergies indicates no known allergies.  Home Medications   Prior to Admission medications   Medication Sig Start Date End Date Taking? Authorizing Provider  albuterol (PROVENTIL HFA;VENTOLIN HFA) 108 (90 BASE) MCG/ACT inhaler Inhale 2 puffs into the lungs every 6 (six) hours as needed for wheezing. 08/16/12   Deneise Lever, MD   Meds Ordered and Administered this Visit   Medications  triamcinolone acetonide (KENALOG-40) injection 40 mg (not administered)    BP 125/85 mmHg  Pulse 72  Temp(Src) 97.7 F (36.5 C) (Oral)  Resp 16  Wt 285 lb (129.275 kg)  SpO2 98% No data found.   Physical Exam  Constitutional: He is oriented to person,  place, and time. He appears well-developed and well-nourished. No distress.  HENT:  Head: Normocephalic.    Nose: Nose normal.  Mouth/Throat: Oropharynx is clear and moist.  There is macular erythema on face as noted on diagram.  No swelling, warmth, or tenderness to palpation.    Eyes: Conjunctivae are normal. Pupils are equal, round, and reactive to light.  Musculoskeletal:       Hands: There is macular erythema in web spaces of hands, primarily on the right  Neurological: He is alert and oriented to person, place, and time.  Skin: Skin is warm and dry. Rash noted.  Nursing note and vitals reviewed.   ED Course  Procedures  None    MDM   1. Rhus dermatitis    Administered Kenalog 31m IM  May take antihistamine such as Benadryl or Zyrtec as needed for itching. Call if not improving about 3 days.   SKandra Nicolas MD 09/10/15 1047

## 2015-09-10 NOTE — ED Notes (Signed)
Pt c/o rash to face, between fingers and a couple spots on his legs x 3 days. Yard work done this past weekend.

## 2015-09-10 NOTE — Discharge Instructions (Signed)
May take antihistamine such as Benadryl or Zyrtec as needed for itching.   Poison [employer] ivy is a rash caused by touching the leaves of the poison ivy plant. The rash often shows up 48 hours later. You might just have bumps, redness, and itching. Sometimes, blisters appear and break open. Your eyes may get puffy (swollen). Poison ivy often heals in 2 to 3 weeks without treatment. HOME CARE  If you touch poison ivy:  Wash your skin with soap and water right away. Wash under your fingernails. Do not rub the skin very hard.  Wash any clothes you were wearing.  Avoid poison ivy in the future. Poison ivy has 3 leaves on a stem.  Use medicine to help with itching as told by your doctor. Do not drive when you take this medicine.  Keep open sores dry, clean, and covered with a bandage and medicated cream, if needed.  Ask your doctor about medicine for children. GET HELP RIGHT AWAY IF:  You have open sores.  Redness spreads beyond the area of the rash.  There is yellowish white fluid (pus) coming from the rash.  Pain gets worse.  You have a temperature by mouth above 102 F (38.9 C), not controlled by medicine. MAKE SURE YOU:  Understand these instructions.  Will watch your condition.  Will get help right away if you are not doing well or get worse.   This information is not intended to replace advice given to you by your health care provider. Make sure you discuss any questions you have with your health care provider.   Document Released: 07/18/2010 Document Revised: 09/07/2011 Document Reviewed: 11/21/2014 Elsevier Interactive Patient Education [employer].

## 2018-01-25 ENCOUNTER — Emergency Department (INDEPENDENT_AMBULATORY_CARE_PROVIDER_SITE_OTHER)
Admission: EM | Admit: 2018-01-25 | Discharge: 2018-01-25 | Disposition: A | Discharge status: Home / Self Care | Payer: BLUE CROSS/BLUE SHIELD | Source: Home / Self Care

## 2018-01-25 ENCOUNTER — Other Ambulatory Visit: Payer: Self-pay

## 2018-01-25 DIAGNOSIS — L237 Allergic contact dermatitis due to plants, except food: Secondary | ICD-10-CM

## 2018-01-25 MED ORDER — PREDNISONE 10 MG PO TABS: ORAL_TABLET | ORAL | 0 refills | Status: AC

## 2018-01-25 MED ORDER — METHYLPREDNISOLONE SODIUM SUCC 125 MG IJ SOLR
125.0000 mg | Freq: Once | INTRAMUSCULAR | Status: AC
  Administered 2018-01-25: 125 mg via INTRAMUSCULAR

## 2018-01-25 NOTE — ED Triage Notes (Signed)
Pt c/o itchy rash all over lower legs and both hands. Was doing yardwork Sunday and suspects its poison ivy or sumac. Tried benedryl cream with no relief.

## 2018-01-25 NOTE — Discharge Instructions (Signed)
Return if any problems.

## 2018-01-26 NOTE — ED Provider Notes (Signed)
[facility]    CSN: [id number] Arrival date & time: 01/25/18  1344     History   Chief Complaint Chief Complaint  Patient presents with  . Mike Ivy    Possible    HPI Mike Meyers. is a 47 y.o. male.   The history is provided by the patient. No language interpreter was used.  Mike Meyers  This is a new problem. The current episode started 2 days ago. The problem occurs constantly. The problem has been gradually worsening. Nothing aggravates the symptoms. Nothing relieves the symptoms. He has tried nothing for the symptoms. The treatment provided no relief.  Pt reports he was exposed to Mike ivy 2 days ago.  Pt reports getting worse.    Past Medical History:  Diagnosis Date  . Sleep apnea     There are no active problems to display for this patient.   Past Surgical History:  Procedure Laterality Date  . HERNIA REPAIR         Home Medications    Prior to Admission medications   Medication Sig Start Date End Date Taking? Authorizing Provider  albuterol (PROVENTIL HFA;VENTOLIN HFA) 108 (90 BASE) MCG/ACT inhaler Inhale 2 puffs into the lungs every 6 (six) hours as needed for wheezing. 08/16/12   Deneise Lever, MD  predniSONE (DELTASONE) 10 MG tablet 6,6,5,5,4,4,3,3,2,2,1,1 taper dose 01/25/18   Fransico Meadow, PA-C    Family History Family History  Problem Relation Age of Onset  . Diabetes Father   . Rheum arthritis Father   . Cancer Mother     Social History Social History   Tobacco Use  . Smoking status: Never Smoker  . Smokeless tobacco: Never Used  Substance Use Topics  . Alcohol use: No  . Drug use: No     Allergies   Patient has no known allergies.   Review of Systems Review of Systems  Skin: Positive for rash.  All other systems reviewed and are negative.    Physical Exam Triage Vital Signs ED Triage Vitals [01/25/18 1411]  Enc Vitals Group     BP 130/83     Pulse Rate 88     Resp      Temp 98.2 F (36.8  C)     Temp Source Oral     SpO2 97 %     Weight (!) 303 lb (137.4 kg)     Height 6' (1.829 m)     Head Circumference      Peak Flow      Pain Score 0     Pain Loc      Pain Edu?      Excl. in [place]?    No data found.  Updated Vital Signs BP 130/83 (BP Location: Right Arm)   Pulse 88   Temp 98.2 F (36.8 C) (Oral)   Ht 6' (1.829 m)   Wt (!) 303 lb (137.4 kg)   SpO2 97%   BMI 41.09 kg/m   Visual Acuity Right Eye Distance:   Left Eye Distance:   Bilateral Distance:    Right Eye Near:   Left Eye Near:    Bilateral Near:     Physical Exam  Constitutional: He appears well-developed and well-nourished.  HENT:  Head: Normocephalic.  Right Ear: External ear normal.  Left Ear: External ear normal.  Mouth/Throat: Oropharynx is clear and moist.  Eyes: Pupils are equal, round, and reactive to light.  Cardiovascular: Normal rate and regular rhythm.  Pulmonary/Chest: Effort normal.  Neurological: He is alert.  Skin: Rash noted.  Linear streaks of red raised rash   Nursing note and vitals reviewed.    UC Treatments / Results  Labs (all labs ordered are listed, but only abnormal results are displayed) Labs Reviewed - No data to display  EKG None  Radiology No results found.  Procedures Procedures (including critical care time)  Medications Ordered in UC Medications  methylPREDNISolone sodium succinate (SOLU-MEDROL) 125 mg/2 mL injection 125 mg (125 mg Intramuscular Given 01/25/18 1422)    Initial Impression / Assessment and Plan / UC Course  I have reviewed the triage vital signs and the nursing notes.  Pertinent labs & imaging results that were available during my care of the patient were reviewed by me and considered in my medical decision making (see chart for details).      Final Clinical Impressions(s) / UC Diagnoses   Final diagnoses:  Mike ivy     Discharge Instructions     Return if any problems.   ED Prescriptions    Medication Sig  Dispense Auth. Provider   predniSONE (DELTASONE) 10 MG tablet 6,6,5,5,4,4,3,3,2,2,1,1 taper dose 42 tablet Fransico Meadow, [place]     Controlled Substance Prescriptions [place] Controlled Substance Registry consulted? Not Applicable\ An After Visit Summary was printed and given to the patient.    Fransico Meadow, [place] 01/26/18 4097
